# Patient Record
Sex: Male | Born: 1990 | Race: White | Hispanic: No | Marital: Married | Smoking: Never smoker
Health system: Southern US, Community
[De-identification: ages and names within clinical notes are randomized; demographics above are authoritative.]

---

## 2014-07-20 ENCOUNTER — Other Ambulatory Visit (INDEPENDENT_AMBULATORY_CARE_PROVIDER_SITE_OTHER): Payer: BLUE CROSS/BLUE SHIELD

## 2014-07-20 ENCOUNTER — Ambulatory Visit (INDEPENDENT_AMBULATORY_CARE_PROVIDER_SITE_OTHER): Payer: BLUE CROSS/BLUE SHIELD | Admitting: Family Medicine

## 2014-07-20 ENCOUNTER — Other Ambulatory Visit: Payer: Self-pay | Admitting: Family Medicine

## 2014-07-20 ENCOUNTER — Ambulatory Visit (INDEPENDENT_AMBULATORY_CARE_PROVIDER_SITE_OTHER)
Admission: RE | Admit: 2014-07-20 | Discharge: 2014-07-20 | Disposition: A | Discharge status: Home / Self Care | Payer: BLUE CROSS/BLUE SHIELD | Source: Ambulatory Visit | Attending: Family Medicine | Admitting: Family Medicine

## 2014-07-20 ENCOUNTER — Encounter: Payer: Self-pay | Admitting: Family Medicine

## 2014-07-20 VITALS — BP 104/78 | Ht 74.0 in | Wt 196.0 lb

## 2014-07-20 DIAGNOSIS — M5412 Radiculopathy, cervical region: Secondary | ICD-10-CM | POA: Insufficient documentation

## 2014-07-20 DIAGNOSIS — M79602 Pain in left arm: Secondary | ICD-10-CM

## 2014-07-20 DIAGNOSIS — M549 Dorsalgia, unspecified: Secondary | ICD-10-CM

## 2014-07-20 DIAGNOSIS — M6281 Muscle weakness (generalized): Secondary | ICD-10-CM

## 2014-07-20 MED ORDER — PREDNISONE 50 MG PO TABS: ORAL_TABLET | ORAL | Status: DC

## 2014-07-20 NOTE — Assessment & Plan Note (Addendum)
Patient's muscle weakness I think is secondary to a nerve. I do believe that this mostly a C5 nerve root impingement of some sort. With C5 and C6 weakness that upper cord or lateral column could be causing the discomfort. 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 Differential includes cervical radiculopathy, thoracic outlet syndrome and brachial plexus, with it being asymmetric spinal cord disease as well as spinal stenosis has to be considered. Myasthenia gravis as well as demyelinating syndrome such as multiple sclerosis is also within the differential still at this time. I am hoping patient is having more of a mononeuropathy. Patient will be treated with prednisone at this time.  Patient does have more of a peripheral nerve with patient having a decrease deep tendon reflexes. Do not think that this is likely more of an upper nerve and more peripheral in nature. Patient responds to the prednisone but starts having worsening symptoms again further workup including lab workup for hypothyroidism, hypoglycemia, lead toxicity, B12 deficiency could be contribute in. We will discuss this at follow-up.  Patient also be referred to neurology for further evaluation. X-rays of the cervical neck were also ordered today to see if we can evaluate for spinal stenosis. Advance imaging may be necessary in the long run.

## 2014-07-20 NOTE — Patient Instructions (Addendum)
Good to meet you Harold Rice is your friend.  Prednisone 50mg  daily for 5 days.  Continue to monitor symptoms Lets get xray downstairs and will get Neuro involved.  Vitamin D 2000 IU daily  See me again  1 week.

## 2014-07-20 NOTE — Progress Notes (Signed)
[facility] [address] [place], [place] [address] Phone: [phone] Subjective:     CC: Left arm weakness  SLP:[id number] Harold Rice is a 24 y.o. male coming in with complaint of left arm weakness. Patient states he has been seen a physical therapist for another problem and was doing some self manual technique at home. Patient was laying on top of a ball and when he went up he started having of weird sensation going down his left arm. Patient states that since then he has noticed significant weakness in the left arm. Patient was seen by the physical therapist was told to come here for further evaluation. Patient states that he is having numbness that is associated with this mostly in the thumb index and middle finger of the left hand. States that he has difficulty holding anything in that hand that is of any weight. Patient states he can bring the shoulder up once he gets above 90 the shoulder has weakness and numbness difficult to hold in a specific area. Patient has never had any history of this previously. Patient denies any recent illnesses. Patient states that there has been some mild neck discomfort. Patient states certain activities he can still do. Patient has only noticed some very minimal improvement since the initial incident.       Past medical history, social, surgical and family history all reviewed in electronic medical record.   Review of Systems: No headache, visual changes, nausea, vomiting, diarrhea, constipation, dizziness, abdominal pain, skin rash, fevers, chills, night sweats, weight loss, swollen lymph nodes, body aches, joint swelling, muscle aches, chest pain, shortness of breath, mood changes.   Objective Blood pressure 104/78, height 6\' 2"  (1.88 m), weight 196 lb (88.905 kg).  General: No apparent distress alert and oriented x3 mood and affect normal, dressed appropriately. Minorly pale HEENT: Pupils equal, extraocular movements  intact  Respiratory: Patient's speak in full sentences and does not appear short of breath  Cardiovascular: No lower extremity edema, non tender, no erythema  Skin: Warm dry intact with no signs of infection or rash on extremities or on axial skeleton.  Abdomen: Soft nontender  Neuro: Cranial nerves II through XII are intact, neurovascularly intact in all extremities   Lymph: No lymphadenopathy of posterior or anterior cervical chain or axillae bilaterally.  Gait normal with good balance and coordination.  MSK:  Non tender with full range of motion and good stability and symmetric strength and tone of  elbows, wrist, hip, knee and ankles bilaterally.  Shoulder: Left Inspection reveals no abnormalities, atrophy or asymmetry. Palpation is normal with no tenderness over AC joint or bicipital groove. ROM is full in all planes passively. Actively patient can get the full range of motion but: Greater than 95 and forward flexion does cause patient have some instability. Rotator cuff strength normal throughout. No signs of impingement with negative Neer and Hawkin's tests, empty can sign. Speeds and Yergason's tests normal. No labral pathology noted with negative Obrien's, negative clunk and good stability. Normal scapular function observed. No painful arc and no drop arm sign. No apprehension sign Patient is found to have weakness though of the bicep. Patient is unable to have any strength. Patient is 2 out of 5 compared to 5 out of 5 on the contralateral side. Patient does have some mild weakness of the tricep tendon. Patient see tendon reflexes are decreased at 0-1+ on the left upper extremity of the bicep as well as the brachial radialis  with 2+ to the tricep. Contralateral side is 2+. Patient does have weakness of pincer grasp. Mild hypotonia of the bicep compared to the other side already.  Neck: Inspection unremarkable. No palpable stepoffs. Negative Spurling's maneuver. Full neck range  of motion  MSK US performed of: Left This study was ordered, performed, and interpreted by Terrilee Files D.O.  Wrist: All extensor compartments visualized and tendons all normal in appearance without fraying, tears, or sheath effusions. No effusion seen. TFCC intact. Scapholunate ligament intact. Carpal tunnel visualized and median nerve area normal, flexor tendons all normal in appearance without fraying, tears, or sheath effusions. Power doppler signal normal.  IMPRESSION:  NORMAL ULTRASONOGRAPHIC EXAMINATION OF THE WRIST.    Impression and Recommendations:     This case required medical decision making of moderate complexity.

## 2014-07-22 ENCOUNTER — Ambulatory Visit (INDEPENDENT_AMBULATORY_CARE_PROVIDER_SITE_OTHER): Payer: BLUE CROSS/BLUE SHIELD | Admitting: Neurology

## 2014-07-22 ENCOUNTER — Other Ambulatory Visit: Payer: Self-pay | Admitting: *Deleted

## 2014-07-22 ENCOUNTER — Other Ambulatory Visit: Payer: Self-pay | Admitting: Family Medicine

## 2014-07-22 ENCOUNTER — Telehealth: Payer: Self-pay | Admitting: Family Medicine

## 2014-07-22 ENCOUNTER — Encounter: Payer: Self-pay | Admitting: Neurology

## 2014-07-22 VITALS — BP 104/62 | HR 53 | Resp 16 | Ht 75.0 in | Wt 196.0 lb

## 2014-07-22 DIAGNOSIS — M5412 Radiculopathy, cervical region: Secondary | ICD-10-CM

## 2014-07-22 DIAGNOSIS — R29898 Other symptoms and signs involving the musculoskeletal system: Secondary | ICD-10-CM

## 2014-07-22 DIAGNOSIS — M6281 Muscle weakness (generalized): Secondary | ICD-10-CM

## 2014-07-22 NOTE — Telephone Encounter (Signed)
Left msg on voicemail to call me back need to discuss MRI.

## 2014-07-22 NOTE — Progress Notes (Signed)
NEUROLOGY CONSULTATION NOTE  Martinique Houde MRN: [id number] DOB: [birth year]  Referring provider: Dr. Hulan Saas Primary care provider: none  Reason for consult:  Left arm weakness and numbness  Dear Dr Tamala Julian:  Thank you for your kind referral of Martinique Magan for consultation of the above symptoms. Although his history is well known to you, please allow me to reiterate it for the purpose of our medical record. Records and images were personally reviewed where available.  HISTORY OF PRESENT ILLNESS: This is a 24 year old right-handed man with a history of left hip pain since July 2011, who was doing PT exercises 2 weeks ago while laying on top of a ball with both arms flexed behind his head when he started having a sensation of ice water going down his left arm after he finished the exercise. This was followed by numbness and tingling in the left thumb, index, and middle finger. He noticed difficulty holding things in his left hand. He did not report any significant pain at that time but has some neck tightness and soreness. He was evaluated 2 days ago, with note of 2/5 strength of the left bicep, mild weakness at tricep, weakness of pincer grasp, asymmetric reduced reflexes on the left UE. Xray of the cervical spine did not show any acute change, there was mild neural foraminal encroachment on the right at C3. Ultrasound of the wrist normal. He was started on Prednisone, currently on day 2 of steroids.   He denies any right-sided or leg symptoms. He has been seeing a Restaurant manager, fast food, acupuncturist, and PT for left hip pain since July 2011. He reports that "nothing helps." He initially had pain in his left lower back that moved to his left hip, into the psoas/groin area. Pain is worse getting out of bed. He reports a constant low-grade dull pain and soreness in the region. No bowel/bladder dysfunction or leg weakness. He denies any headaches, dizziness, diplopia, dysarthria, dysphagia. No falls.  No family history of similar symptoms.   PAST MEDICAL HISTORY: No past medical history on file.  PAST SURGICAL HISTORY: No past surgical history on file.  MEDICATIONS: Current Outpatient Prescriptions on File Prior to Visit  Medication Sig Dispense Refill  . predniSONE (DELTASONE) 50 MG tablet 1 tab daily for 5 days. 5 tablet 0   No current facility-administered medications on file prior to visit.    ALLERGIES: Allergies  Allergen Reactions  . Amoxicillin Rash    FAMILY HISTORY: No family history on file.  SOCIAL HISTORY: History   Social History  . Marital Status: Single    Spouse Name: N/A    Number of Children: N/A  . Years of Education: N/A   Occupational History  . Not on file.   Social History Main Topics  . Smoking status: Never Smoker   . Smokeless tobacco: Not on file  . Alcohol Use: No     Comment: Rarely  . Drug Use: Not on file  . Sexual Activity: Not on file   Other Topics Concern  . Not on file   Social History Narrative    REVIEW OF SYSTEMS: Constitutional: No fevers, chills, or sweats, no generalized fatigue, change in appetite Eyes: No visual changes, double vision, eye pain Ear, nose and throat: No hearing loss, ear pain, nasal congestion, sore throat Cardiovascular: No chest pain, palpitations Respiratory:  No shortness of breath at rest or with exertion, wheezes GastrointestinaI: No nausea, vomiting, diarrhea, abdominal pain, fecal incontinence Genitourinary:  No dysuria, urinary  retention or frequency Musculoskeletal:  + neck pain, back pain Integumentary: No rash, pruritus, skin lesions Neurological: as above Psychiatric: No depression, insomnia, anxiety Endocrine: No palpitations, fatigue, diaphoresis, mood swings, change in appetite, change in weight, increased thirst Hematologic/Lymphatic:  No anemia, purpura, petechiae. Allergic/Immunologic: no itchy/runny eyes, nasal congestion, recent allergic reactions, rashes  PHYSICAL  EXAM: Filed Vitals:   07/22/14 0940  BP: 104/62  Pulse: 53  Resp: 16   General: No acute distress Head:  Normocephalic/atraumatic Eyes: Fundoscopic exam shows bilateral sharp discs, no vessel changes, exudates, or hemorrhages Neck: supple, no paraspinal tenderness, full range of motion Back: No paraspinal tenderness Heart: regular rate and rhythm Lungs: Clear to auscultation bilaterally. Vascular: No carotid bruits. Skin/Extremities: No rash, no edema. Rash over the forehead and right arm, left leg from ivy plant Neurological Exam: Mental status: alert and oriented to person, place, and time, no dysarthria or aphasia, Fund of knowledge is appropriate.  Recent and remote memory are intact.  Attention and concentration are normal.    Able to name objects and repeat phrases. Cranial nerves: CN I: not tested CN II: pupils equal, round and reactive to light, visual fields intact, fundi unremarkable. CN III, IV, VI:  full range of motion, no nystagmus, no ptosis CN V: facial sensation intact CN VII: upper and lower face symmetric CN VIII: hearing intact to finger rub CN IX, X: gag intact, uvula midline CN XI: sternocleidomastoid and trapezius muscles intact CN XII: tongue midline Bulk & Tone: normal, no fasciculations. Motor: 5/5 on the right UE, both LE. He has 4/5 strength on left shoulder abduction, elbow flexion, 4+/5 shoulder adduction, 5/5 elbow extension, pronation/supination, wrist extension, finger extension, intrinsics. Sensation: reports sensation is equal, however he has spreading paresthesias with pin on the left C6-7 dermatomal distribution (upper arm, down to thumb, index and middle fingers). Intact to light touch, cold, pin, vibration and joint position sense.  No extinction to double simultaneous stimulation.  Romberg test negative Deep Tendon Reflexes: unable to elicit reflexes on the left UE, +2 on right UE and both LE, no ankle clonus Plantar responses: downgoing  bilaterally Cerebellar: no incoordination on finger to nose, heel to shin. No dysdiadochokinesia Gait: narrow-based and steady, able to tandem walk adequately. Tremor: none +Tinel sign on the left wrist  IMPRESSION: This is a 24 year old right-handed man with a history of left hip pain since 2011, presenting with left arm weakness and numbness affecting mostly the C5-7 distribution. Considerations include cervical radiculopathy versus brachial neuritis. He will be scheduled for an EMG/NCV with Dr. Posey Pronto. He will benefit from MRI cervical spine with and without contrast. He tells me he thought this visit was for an MRI and that this has been scheduled for him by Dr. Tamala Julian, he will let us know when test is done. Bloodwork for ESR, CRP, ANA, ENA, TSH, and B12 will be ordered. If tests unrevealing, MRI brachial plexus will be ordered. We discussed treatment with physical therapy and completion of steroid course. He will follow-up after the EMG.   Thank you for allowing me to participate in the care of this patient. Please do not hesitate to call for any questions or concerns.   Ellouise Newer, M.D.  CC: Dr. Tamala Julian

## 2014-07-22 NOTE — Patient Instructions (Signed)
1. Schedule EMG/NCV of the left UE with Dr. Allena Katz 2. Proceed with MRI as discussed with Dr. Katrinka Blazing 3. Continue with Prednisone 4. Proceed with physical therapy for left arm weakness 5. Follow-up after EMG

## 2014-07-22 NOTE — Telephone Encounter (Signed)
-----  Message from Cameron Sprang, MD sent at 07/22/2014 12:53 PM EST ----- Regarding: MRI Patient thought he was here for an MRI today. Can you pls ask him to let us know when/if he is scheduled for MRI. If none, would do MRI cervical spine with and without contrast. Also, let him know that if no bloodwork done with Dr. Tamala Julian, let's do ESR, CRP, ANA, ENA, TSH, and B12. Thanks!

## 2014-07-23 NOTE — Telephone Encounter (Signed)
Patient returned my call. He is already scheduled for a MRI C-spine for next week. He didn't have any labs done with Dr. Katrinka Blazing. Lab orders placed in Epic he will come pick up order at front desk & take to [facility] for blood draw.

## 2014-07-23 NOTE — Addendum Note (Signed)
Addended by: Franciso Bend on: 07/23/2014 03:09 PM   Modules accepted: Orders

## 2014-07-27 ENCOUNTER — Ambulatory Visit: Payer: BLUE CROSS/BLUE SHIELD | Admitting: Family Medicine

## 2014-07-29 ENCOUNTER — Ambulatory Visit
Admission: RE | Admit: 2014-07-29 | Discharge: 2014-07-29 | Disposition: A | Discharge status: Home / Self Care | Payer: BLUE CROSS/BLUE SHIELD | Source: Ambulatory Visit | Attending: Family Medicine | Admitting: Family Medicine

## 2014-07-29 DIAGNOSIS — M6281 Muscle weakness (generalized): Secondary | ICD-10-CM

## 2014-07-29 LAB — C-REACTIVE PROTEIN

## 2014-07-29 LAB — TSH: TSH: 1.925 u[IU]/mL (ref 0.350–4.500)

## 2014-07-29 LAB — SEDIMENTATION RATE: SED RATE: 1 mm/h (ref 0–16)

## 2014-07-29 LAB — VITAMIN B12: Vitamin B-12: 646 pg/mL (ref 211–911)

## 2014-07-30 ENCOUNTER — Ambulatory Visit
Admission: RE | Admit: 2014-07-30 | Discharge: 2014-07-30 | Disposition: A | Discharge status: Home / Self Care | Payer: BLUE CROSS/BLUE SHIELD | Source: Ambulatory Visit | Attending: Family Medicine | Admitting: Family Medicine

## 2014-07-30 DIAGNOSIS — M6281 Muscle weakness (generalized): Secondary | ICD-10-CM

## 2014-07-30 LAB — ENA 9 PANEL
Centromere Ab Screen: <1
DS DNA AB: 1 [IU]/mL
ENA SM Ab Ser-aCnc: <1
Jo-1 Antibody, IgG: <1
Ribosomal P Protein Ab: <1
SM/RNP: NEGATIVE
SSA (Ro) (ENA) Antibody, IgG: <1
SSB (LA) (ENA) ANTIBODY, IGG: NEGATIVE
Scleroderma (Scl-70) (ENA) Antibody, IgG: <1

## 2014-07-30 LAB — ANA: ANA: NEGATIVE

## 2014-07-30 NOTE — Telephone Encounter (Signed)
Pls let him know bloodwork that has come back are normal, including thyroid, B12, and inflammatory markers. Awaiting results of 2 more tests, will update him as they come. Thanks      The other bloodwork have come back, pls let him know these are normal, no evidence of autoimmune disorder. Thanks   Patient was notified of results.

## 2014-08-04 ENCOUNTER — Ambulatory Visit (INDEPENDENT_AMBULATORY_CARE_PROVIDER_SITE_OTHER): Payer: BLUE CROSS/BLUE SHIELD | Admitting: Family Medicine

## 2014-08-04 ENCOUNTER — Encounter: Payer: Self-pay | Admitting: Family Medicine

## 2014-08-04 VITALS — BP 96/62 | HR 49 | Ht 75.0 in | Wt 192.0 lb

## 2014-08-04 DIAGNOSIS — M5412 Radiculopathy, cervical region: Secondary | ICD-10-CM

## 2014-08-04 NOTE — Progress Notes (Signed)
Harold Rice [address] [place], [place] [address] Phone: [phone] Subjective:     CC: Left arm weakness follow-up  DSK:[id number] Harold Rice is a 24 y.o. male coming in with complaint of left arm weakness. Patient was seen previously and was having an acute muscle weakness. There is a questionable of a brachial neuritis. Patient was sent to neurology and has-been worked up by them as well. Patient is here to go over patient's MRI results. Patient did have an MRI of the cervical spine that did not show any significant nerve root impingement. Patient did have mild bulging of herniated discs from seat 3 through C5 but no true nerve root impingement. Patient states that actually he is starting to have increasing sensation as well as increasing strength. Patient is feeling that he can do more and more. Still has some mild difficulty when lifting his shoulder above his head. Patient states though that holding things in his hand is much more tolerable. Patient denies any significant pain associated with this at this time. Patient is scheduled next week to have an EMG and then will be following up with neurology. Patient denies any new symptoms.       Past medical history, social, surgical and family history all reviewed in electronic medical record.   Review of Systems: No headache, visual changes, nausea, vomiting, diarrhea, constipation, dizziness, abdominal pain, skin rash, fevers, chills, night sweats, weight loss, swollen lymph nodes, body aches, joint swelling, muscle aches, chest pain, shortness of breath, mood changes.   Objective Blood pressure 96/62, pulse 49, height 6\' 3"  (1.905 m), weight 192 lb (87.091 kg), SpO2 97 %.  General: No apparent distress alert and oriented x3 mood and affect normal, dressed appropriately. Minorly pale HEENT: Pupils equal, extraocular movements intact  Respiratory: Patient's speak in full sentences and does not appear  short of breath  Cardiovascular: No lower extremity edema, non tender, no erythema  Skin: Warm dry intact with no signs of infection or rash on extremities or on axial skeleton.  Abdomen: Soft nontender  Neuro: Cranial nerves II through XII are intact, neurovascularly intact in all extremities   Lymph: No lymphadenopathy of posterior or anterior cervical chain or axillae bilaterally.  Gait normal with good balance and coordination.  MSK:  Non tender with full range of motion and good stability and symmetric strength and tone of  elbows, wrist, hip, knee and ankles bilaterally.  Shoulder: Left Inspection reveals no abnormalities, atrophy or asymmetry. Palpation is normal with no tenderness over AC joint or bicipital groove. ROM is full in all planes passively. Actively patient can get the full range of motion but: Patient still has some weakness of the shoulder girdle when going above 90 but improved.. Rotator cuff strength normal throughout. No signs of impingement with negative Neer and Hawkin's tests, empty can sign. Speeds and Yergason's tests normal. No labral pathology noted with negative Obrien's, negative clunk and good stability. Normal scapular function observed. No painful arc and no drop arm sign. No apprehension sign Patient is found to have weakness though of the bicep. Patient though has showed improvement h. Patient is 4 out of 5 compared to 5 out of 5 on the contralateral side. Strength of the tricep Patient see tendon reflexes are improved as well to 1+ on the left upper extremity of the bicep as well as the brachial radialis with 2+ to the tricep. Contralateral side is 2+. Patient does have weakness of pincer grasp. Improved  tone of the bicep..  Neck: Inspection unremarkable. No palpable stepoffs. Negative Spurling's maneuver. Full neck range of motion     Impression and Recommendations:     This case required medical decision making of moderate  complexity.

## 2014-08-04 NOTE — Assessment & Plan Note (Signed)
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 Patient is making some improvement. I discussed with patient to continue to try to stay active and to monitor his symptoms. Patient will be following up with neurology after his EMG. I think that this will be very beneficial. I'm hoping patient continues to improve. Patient does not though I do think that further imaging will be necessary such as an MRI of the brachial plexus to help evaluate further. This does not seem to be centrally with the normal cervical MRI. There is no signs of other demyelinating diseases which we can pretty much rule out at this time. Patient's lab work is also been unremarkable. After patient has EMG as well as neurology consult he can follow-up with me with any questions or concerns. Patient will likely follow up with me for other problems such as the hip pain he mentioned when leaving my office.

## 2014-08-04 NOTE — Patient Instructions (Signed)
Good to see you You have improved a lot! Get the EMG and Dr. Karel Jarvis will touch base with you as well.  I am here for any questions.  Once we figure this out then we can consider looking at your hip.

## 2014-08-04 NOTE — Progress Notes (Signed)
Pre visit review using our clinic review tool, if applicable. No additional management support is needed unless otherwise documented below in the visit note. 

## 2014-08-19 ENCOUNTER — Ambulatory Visit (INDEPENDENT_AMBULATORY_CARE_PROVIDER_SITE_OTHER): Payer: BLUE CROSS/BLUE SHIELD | Admitting: Neurology

## 2014-08-19 DIAGNOSIS — R29898 Other symptoms and signs involving the musculoskeletal system: Secondary | ICD-10-CM

## 2014-08-19 DIAGNOSIS — M5412 Radiculopathy, cervical region: Secondary | ICD-10-CM

## 2014-08-19 DIAGNOSIS — R202 Paresthesia of skin: Secondary | ICD-10-CM

## 2014-08-19 NOTE — Procedures (Signed)
[facility]  [address] [place], [address]  [place], [place] [address] Tel: [phone] Fax:  [phone] Test Date:  08/19/2014  Patient: Harold Rice DOB: Nov 27, 1990 Physician: Nita Sickle  Sex: Male Height: 6\' 3"  Ref Phys: Harold Rice  ID#: [id number] Temp: 35.0C Technician: Ala Bent R. NCS T.   Patient Complaints: Patient is a 51 yer old male here for evaluation of his left arm weakness and numbness.  NCV & EMG Findings: Extensive electrodiagnostic testing of the left upper extremity and additional studies of the right shows: 1. Left median, ulnar, and radial sensory responses are within normal limits. Bilateral lateral and median antebrachial cutaneous sensory responses are symmetrically reduced, which is most likely technical in nature. 2. Bilateral ulnar motor responses show slowed conduction velocity across the elbow, with preserved amplitude and latency. The left median motor responses within normal limits.  3. Sparse chronic motor axon loss changes are seen affecting the left deltoid and infraspinatus muscles, without accompanied active denervation.  Impression: 1. 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  2. 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 3. There is no evidence of a brachial plexopathy affecting the left upper extremity.   ___________________________ Nita Sickle    Nerve Conduction Studies Anti Sensory Summary Table   Stim Site NR Peak (ms) Norm Peak (ms) P-T Amp (V) Norm P-T Amp  Left Lat Ante Brach Cutan Anti Sensory (Lat Forearm)  35C  Lat Biceps    2.3 <2.9 15.0 >16  Right Lat Ante Brach Cutan Anti Sensory (Lat Forearm)  35C  Lat Biceps    2.3 <2.9 14.7 >16  Left Med Ante Brach Cutan Anti Sensory (Med Forearm)  35C  Elbow    2.1  16.2   Right Med Ante Brach Cutan Anti Sensory (Med Forearm)  35C  Elbow    2.5  13.2   Left Median  Anti Sensory (2nd Digit)  35C    distance 14cm due to large hands  Wrist    3.1 <3.3 62.5 >20  Left Radial Anti Sensory (Base 1st Digit)  35C  Wrist    2.1 <2.7 41.9 >18  Left Ulnar Anti Sensory (5th Digit)  35C  Wrist    2.9 <3.0 25.3 >18   Motor Summary Table   Stim Site NR Onset (ms) Norm Onset (ms) O-P Amp (mV) Norm O-P Amp Site1 Site2 Delta-0 (ms) Dist (cm) Vel (m/s) Norm Vel (m/s)  Left Median Motor (Abd Poll Brev)  35C  Wrist    3.0 <3.9 13.4 >6 Elbow Wrist 5.0 31.0 62 >51  Elbow    8.0  13.1         Left Ulnar Motor (Abd Dig Minimi)  35C  Wrist    2.3 <3.0 13.4 >8 B Elbow Wrist 4.3 27.0 63 >51  B Elbow    6.6  12.1  A Elbow B Elbow 2.6 10.0 38 >51  A Elbow    9.2  11.1         Right Ulnar Motor (Abd Dig Minimi)  35C  Wrist    2.5 <3.0 13.5 >8 B Elbow Wrist 4.3 26.5 62 >51  B Elbow    6.8  13.2  A Elbow B Elbow 2.3 10.0 43 >51  A Elbow    9.1  12.2          F Wave Studies   NR F-Lat (ms) Lat Norm (ms) L-R  F-Lat (ms)  Left Ulnar (Mrkrs) (Abd Dig Min)  35C     30.80 <33    EMG   Side Muscle Ins Act Fibs Psw Fasc Number Recrt Dur Dur. Amp Amp. Poly Poly. Comment  Left 1stDorInt Nml Nml Nml Nml Nml Nml Nml Nml Nml Nml Nml Nml N/A  Left Abd Poll Brev Nml Nml Nml Nml Nml Nml Nml Nml Nml Nml Nml Nml N/A  Left FlexPolLong Nml Nml Nml Nml Nml Nml Nml Nml Nml Nml Nml Nml N/A  Left Biceps Nml Nml Nml Nml Nml Nml Nml Nml Nml Nml Nml Nml N/A  Left PronatorTeres Nml Nml Nml Nml Nml Nml Nml Nml Nml Nml Nml Nml N/A  Left Ext Indicis Nml Nml Nml Nml Nml Nml Nml Nml Nml Nml Nml Nml N/A  Left Triceps Nml Nml Nml Nml Nml Nml Nml Nml Nml Nml Nml Nml N/A  Left Deltoid Nml Nml Nml Nml 1- Mod Few 1+ Nml Nml Nml Nml N/A  Left Infraspinatus Nml Nml Nml Nml 1- Mod Few 1+ Nml Nml Nml Nml N/A  Left Cervical Parasp Low Nml Nml Nml Nml Nml Nml Nml Nml Nml Nml Nml Nml N/A  Left ABD Dig Min Nml Nml Nml Nml Nml Nml Nml Nml Nml Nml Nml Nml N/A  Right Deltoid Nml Nml Nml Nml Nml Nml Nml Nml Nml  Nml Nml Nml N/A      Waveforms:

## 2014-08-26 ENCOUNTER — Telehealth: Payer: Self-pay | Admitting: Neurology

## 2014-08-26 NOTE — Telephone Encounter (Signed)
Left VM regarding EMG results. Instructed patient to call back.

## 2017-01-29 ENCOUNTER — Ambulatory Visit: Payer: Self-pay | Admitting: Podiatry

## 2017-02-05 ENCOUNTER — Ambulatory Visit (INDEPENDENT_AMBULATORY_CARE_PROVIDER_SITE_OTHER): Payer: 59 | Admitting: Podiatry

## 2017-02-05 ENCOUNTER — Encounter: Payer: Self-pay | Admitting: Podiatry

## 2017-02-05 DIAGNOSIS — M779 Enthesopathy, unspecified: Secondary | ICD-10-CM | POA: Diagnosis not present

## 2017-02-05 DIAGNOSIS — M79671 Pain in right foot: Secondary | ICD-10-CM

## 2017-02-05 DIAGNOSIS — M792 Neuralgia and neuritis, unspecified: Secondary | ICD-10-CM | POA: Diagnosis not present

## 2017-02-05 DIAGNOSIS — G8929 Other chronic pain: Secondary | ICD-10-CM | POA: Diagnosis not present

## 2017-02-05 NOTE — Progress Notes (Signed)
   Subjective:    Patient ID: Harold Rice, male    DOB: 1991/04/07, 26 y.o.   MRN: [id number]  HPI  Harold presents the office today for concerns of right ankle pain which is been ongoing for about 10 years.He is previously seen 2 other podiatrists for this and he had a tight soleus muscle. He was also old that his symptoms were neurological in origin as he was having sharp pains down his leg. He had a nerve conduction t performed which revealed L5-S1 nerve impingement and he was told that he could do steroid injections but he did not want to do this. He was also told by Dr. Elijah Birk that he had an old fracture to his foot and the patient believes that this may be causing some of his symptoms. He also feels that in the front of his ankle he feels like it is "bone on bone". He has seen neurology for this as well. He did try PT but this did not help.    Review of Systems  All other systems reviewed and are negative.      Objective:   Physical Exam General: AAO x3, NAD  Dermatological: Skin is warm, dry and supple bilateral. Nails x 10 are well manicured; remaining integument appears unremarkable at this time. There are no open sores, no preulcerative lesions, no rash or signs of infection present.  Vascular: Dorsalis Pedis artery and Posterior Tibial artery pedal pulses are 2/4 bilateral with immedate capillary fill time.  There is no pain with calf compression, swelling, warmth, erythema.   Neruologic: Grossly intact via light touch bilateral. Vibratory intact via tuning fork bilateral. Protective threshold with Semmes Wienstein monofilament intact to all pedal sites bilateral.   Musculoskeletal: There is no area pinpoint bony tenderness and there is no pain with vibratory sensation identified to bilateral lower extremities. On the right lateral aspect of the foot there is mild tenderness over the sinus tarsi. There is no significant discomfort with ankle or STJ ROM. He does stand up and when he  simulates dorsiflexion of his ankle on the front of his ankle is where he gets pain at times.  Muscular strength 5/5 in all groups tested bilateral.  Gait: Unassisted, Nonantalgic.      Assessment & Plan:  26 year old male with chronic right ankle and foot pain; neuritis -Treatment options discussed including all alternatives, risks, and complications -Etiology of symptoms were discussed -I recommended new x-rays today but he did not want to get them today. He did call Dr. Tasia Catchings office and he is going to pick them up and bring them to the [place] office for me to look at.  -After I look at the x-rays, likely order an MRI due to the chronic pain to his foot. He has seen several other doctors for this without any resolution.  -Discussed steroid injection into the STJ today but will await the x-rays.  -Also discussed getting a forced dorsiflexion ankle x-ray lateral view to see if there is any osseous entrapment.  -Ultimately, I think that some of his symptoms are neurological in origin and encouraged to also follow -up with neurology/PCP  Ovid Curd, DPM

## 2017-02-12 ENCOUNTER — Telehealth: Payer: Self-pay | Admitting: *Deleted

## 2017-02-12 DIAGNOSIS — M779 Enthesopathy, unspecified: Secondary | ICD-10-CM

## 2017-02-12 DIAGNOSIS — M25521 Pain in right elbow: Secondary | ICD-10-CM

## 2017-02-12 DIAGNOSIS — M792 Neuralgia and neuritis, unspecified: Secondary | ICD-10-CM

## 2017-02-12 DIAGNOSIS — M79671 Pain in right foot: Secondary | ICD-10-CM

## 2017-02-12 DIAGNOSIS — G8929 Other chronic pain: Secondary | ICD-10-CM

## 2017-02-12 NOTE — Telephone Encounter (Addendum)
----- -  Message from Vivi Barrack, DPM sent at 02/11/2017 12:34 PM EDT ----- Can you please let him know that I reviewed the x-rays and I do not appreciate any significant fracture. Given duration of symptoms, please order an MRI of the right foot and ankle to rule out any tendon tear and arthritis. 02/12/2017-Left Message for pt to call for instructions. 02/13/2017-Left message for pt to call for instructions.02/18/2017-Due to the importance of Dr. Gabriel Rung orders I informed pt the orders for the right foot and ankle MRI would be faxed to the [facility] and they would call to schedule an appt, and to call me with any concerns. Pt returned my call states he had returned from vacation. Pt states he had a nerve conduction test and was told the pain in his foot was from the spine and wanted to know if Dr. Ardelle Anton would order a MRI of the spine. I sent message to Dr. Ardelle Anton. Gave orders to [facility] for pre-cert and faxed to [facility].02/21/2017-Left message informing pt of Dr. Gabriel Rung recommendation to have PCP order the back MRI, gave [facility] [phone] to coordinated back and lower extremity MRIs.03/04/2017-Left message informing pt that his insurance was requiring a PEER TO PEER for Ankle MRI and Dr. Ardelle Anton wanted to know if he still wanted the ankle MRI or if he was just getting the back MRI from his PCP.

## 2017-02-13 NOTE — Telephone Encounter (Deleted)
-----   Message from Vivi Barrack, DPM sent at 02/11/2017 12:34 PM EDT ----- Can you please let him know that I reviewed the x-rays and I do not appreciate any significant fracture. Given duration of symptoms, please order an MRI of the right foot and ankle to rule out any tendon tear and arthritis.

## 2017-02-18 NOTE — Telephone Encounter (Signed)
Can you see when he had this NCV done? I don't have the copies. I do think that this is mostly coming from his back. I had discussed this with him at his appointment but he has been told that he had an "old break" on x-ray and he wanted to investigate that further.

## 2017-02-19 NOTE — Telephone Encounter (Signed)
Yes, I saw that and we discussed this. He even told me he didn't think it was coming from his back when I saw him. He needs to see his PCP in order to order an MRI of his lumbar spine.

## 2017-12-26 DIAGNOSIS — M5416 Radiculopathy, lumbar region: Secondary | ICD-10-CM | POA: Insufficient documentation

## 2017-12-26 DIAGNOSIS — M5136 Other intervertebral disc degeneration, lumbar region: Secondary | ICD-10-CM | POA: Insufficient documentation

## 2017-12-26 HISTORY — DX: Other intervertebral disc degeneration, lumbar region: M51.36

## 2018-01-17 DIAGNOSIS — Q7649 Other congenital malformations of spine, not associated with scoliosis: Secondary | ICD-10-CM | POA: Insufficient documentation

## 2018-01-17 HISTORY — DX: Other congenital malformations of spine, not associated with scoliosis: Q76.49

## 2018-03-10 DIAGNOSIS — M25561 Pain in right knee: Secondary | ICD-10-CM | POA: Insufficient documentation

## 2018-05-19 ENCOUNTER — Ambulatory Visit (INDEPENDENT_AMBULATORY_CARE_PROVIDER_SITE_OTHER): Payer: 59 | Admitting: Neurology

## 2018-05-19 ENCOUNTER — Encounter: Payer: Self-pay | Admitting: Neurology

## 2018-05-19 DIAGNOSIS — M79604 Pain in right leg: Secondary | ICD-10-CM

## 2018-05-19 NOTE — Progress Notes (Signed)
Please refer to EMG and nerve conduction procedure note.  

## 2018-05-19 NOTE — Progress Notes (Addendum)
Please refer to EMG nerve conduction study procedure note.   MNC    Nerve / Sites Muscle Latency Ref. Amplitude Ref. Rel Amp Segments Distance Velocity Ref. Area    ms ms mV mV %  cm m/s m/s mVms  R Peroneal - EDB     Ankle EDB 3.5 ?6.5 8.0 ?2.0 100 Ankle - EDB 9   27.1     Fib head EDB 10.1  7.4  93.6 Fib head - Ankle 36 54 ?44 26.0     Pop fossa EDB 12.3  7.2  96.2 Pop fossa - Fib head 10 46 ?44 24.7         Pop fossa - Ankle      R Tibial - AH     Ankle AH 3.7 ?5.8 14.2 ?4.0 100 Ankle - AH 9   31.2     Pop fossa AH 12.3  11.4  80.5 Pop fossa - Ankle 41 48 ?41 30.8         SNC    Nerve / Sites Rec. Site Peak Lat Ref.  Amp Ref. Segments Distance    ms ms V V  cm  R Sural - Ankle (Calf)     Calf Ankle 3.5 ?4.4 14 ?6 Calf - Ankle 14  R Superficial peroneal - Ankle     Lat leg Ankle 3.9 ?4.4 10 ?6 Lat leg - Ankle 14         F  Wave    Nerve F Lat Ref.   ms ms  R Tibial - AH 52.3 ?56.0       H Reflex    Nerve H Lat Lat Hmax   ms ms   Left Right Ref. Left Right Ref.  Tibial - Soleus 32.5 32.8 ?35.0 32.5 33.1 ?35.0

## 2018-05-19 NOTE — Procedures (Signed)
     HISTORY:  Harold Rice is a 27 year old gentleman with a several year history of pain in the foot and ankle and some right hip and groin pain as well, he believes there is weakness of the right leg.  He is being evaluated for these issues.  NERVE CONDUCTION STUDIES:  Nerve conduction studies were performed on right lower extremity. The distal motor latencies and motor amplitudes for the peroneal and posterior tibial nerves were within normal limits. The nerve conduction velocities for these nerves were also normal. The sensory latencies for the peroneal and sural nerves were within normal limits. The F wave latency for the posterior tibial nerve was within normal limits.   EMG STUDIES:  EMG study was performed on the right lower extremity:  The tibialis anterior muscle reveals 2 to 4K motor units with full recruitment. No fibrillations or positive waves were seen. The peroneus tertius muscle reveals 2 to 4K motor units with full recruitment. No fibrillations or positive waves were seen. The medial gastrocnemius muscle reveals 1 to 3K motor units with full recruitment. No fibrillations or positive waves were seen. The vastus lateralis muscle reveals 2 to 4K motor units with full recruitment. No fibrillations or positive waves were seen. The iliopsoas muscle reveals 2 to 4K motor units with full recruitment. No fibrillations or positive waves were seen. The biceps femoris muscle (long head) reveals 2 to 4K motor units with full recruitment. No fibrillations or positive waves were seen. The lumbosacral paraspinal muscles were tested at 3 levels, and revealed no abnormalities of insertional activity at all 3 levels tested. There was good relaxation.   IMPRESSION:  Nerve conduction studies done on the right lower extremity were unremarkable.  No evidence of a neuropathy was seen.  EMG evaluation of the right lower extremity is unremarkable, no evidence of an overlying lumbosacral  radiculopathy or sciatic neuropathy was seen.  [name] MD 05/19/2018 9:01 AM  [facility] [address] [place], [place] [address]  Phone [phone] Fax [phone]

## 2018-07-28 ENCOUNTER — Ambulatory Visit (INDEPENDENT_AMBULATORY_CARE_PROVIDER_SITE_OTHER): Payer: 59

## 2018-07-28 ENCOUNTER — Ambulatory Visit (INDEPENDENT_AMBULATORY_CARE_PROVIDER_SITE_OTHER): Payer: 59 | Admitting: Sports Medicine

## 2018-07-28 ENCOUNTER — Encounter: Payer: Self-pay | Admitting: Sports Medicine

## 2018-07-28 DIAGNOSIS — R202 Paresthesia of skin: Secondary | ICD-10-CM

## 2018-07-28 DIAGNOSIS — G35 Multiple sclerosis: Secondary | ICD-10-CM | POA: Diagnosis not present

## 2018-07-28 DIAGNOSIS — G35D Multiple sclerosis, unspecified: Secondary | ICD-10-CM

## 2018-07-28 DIAGNOSIS — J0100 Acute maxillary sinusitis, unspecified: Secondary | ICD-10-CM | POA: Diagnosis not present

## 2018-07-28 MED ORDER — DULOXETINE HCL 30 MG PO CPEP: 30.0000 mg | ORAL_CAPSULE | Freq: Every day | ORAL | 3 refills | Status: DC

## 2018-07-28 MED ORDER — GADOBENATE DIMEGLUMINE 529 MG/ML IV SOLN
20.0000 mL | Freq: Once | INTRAVENOUS | Status: AC | PRN
  Administered 2018-07-28: 20 mL via INTRAVENOUS

## 2018-07-28 MED ORDER — MELOXICAM 15 MG PO TABS: ORAL_TABLET | ORAL | 3 refills | Status: DC

## 2018-07-28 NOTE — Progress Notes (Signed)
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  This started with left arm numbness and tingling radiating from the neck down to the first and second fingers.  He tells me he had a cervical epidural without any improvement, he had a cervical spine MRI near months ago.  In addition he has had pain in his back with radiation down the right leg.  Moderate, persistent, no bowel or bladder dysfunction, saddle numbness, constitutional symptoms.  He told me he had a lumbar spine MRI that was completely normal.  He was then treated appropriately with SI joint injections that provided good relief of his pain temporarily.  He had a nerve conduction study and EMG that was completely negative.  He has never had a brain MRI and never had lab testing done.  I reviewed the past medical history, family history, social history, surgical history, and allergies today and no changes were needed.  Please see the problem list section below in epic for further details.  Past Medical History: No past medical history on file. Past Surgical History: No past surgical history on file. Social History: Social History   Socioeconomic History  . Marital status: Single    Spouse name: Not on file  . Number of children: Not on file  . Years of education: Not on file  . Highest education level: Not on file  Occupational History  . Not on file  Social Needs  . Financial resource strain: Not on file  . Food insecurity:    Worry: Not on file    Inability: Not on file  . Transportation needs:    Medical: Not on file    Non-medical: Not on file  Tobacco Use  . Smoking status: Never Smoker  . Smokeless tobacco: Never Used  Substance and Sexual Activity  . Alcohol use: No    Alcohol/week: 0.0 standard drinks    Comment: Rarely  . Drug use: Not on file  . Sexual activity: Not on file    Lifestyle  . Physical activity:    Days per week: Not on file    Minutes per session: Not on file  . Stress: Not on file  Relationships  . Social connections:    Talks on phone: Not on file    Gets together: Not on file    Attends religious service: Not on file    Active member of club or organization: Not on file    Attends meetings of clubs or organizations: Not on file    Relationship status: Not on file  Other Topics Concern  . Not on file  Social History Narrative  . Not on file   Family History: No family history on file. Allergies: Allergies  Allergen Reactions  . Amoxicillin Rash   Medications: See med rec.  Review of Systems: No headache, visual changes, nausea, vomiting, diarrhea, constipation, dizziness, abdominal pain, skin rash, fevers, chills, night sweats, swollen lymph nodes, weight loss, chest pain, body aches, joint swelling, muscle aches, shortness of breath, mood changes, visual or auditory hallucinations.  Objective:    General: Well Developed, well nourished, and in no acute distress.  Neuro: Alert and oriented x3, extra-ocular muscles intact, sensation grossly intact.  Cranial nerves II through XII are intact, motor, sensory, coordinative functions are all grossly intact HEENT: Normocephalic, atraumatic, pupils equal round reactive to light, neck supple, no masses, no lymphadenopathy,  thyroid nonpalpable.  Skin: Warm and dry, no rashes noted.  Cardiac: Regular rate and rhythm, no murmurs rubs or gallops.  Respiratory: Clear to auscultation bilaterally. Not using accessory muscles, speaking in full sentences.  Abdominal: Soft, nontender, nondistended, positive bowel sounds, no masses, no organomegaly.  Neck: Negative spurling's Full neck range of motion Grip strength and sensation normal in bilateral hands Strength good C4 to T1 distribution No sensory change to C4 to T1 Reflexes normal Back Exam:  Inspection: Unremarkable  Motion: Flexion 45  deg, Extension 45 deg, Side Bending to 45 deg bilaterally,  Rotation to 45 deg bilaterally  SLR laying: Negative  XSLR laying: Negative  Palpable tenderness: None. FABER: negative. Sensory change: Gross sensation intact to all lumbar and sacral dermatomes.  Reflexes: 2+ at both patellar tendons, 2+ at achilles tendons, Babinski's downgoing.  Strength at foot  Plantar-flexion: 5/5 Dorsi-flexion: 5/5 Eversion: 5/5 Inversion: 5/5  Leg strength  Quad: 5/5 Hamstring: 5/5 Hip flexor: 5/5 Hip abductors: 5/5  Gait unremarkable.  Impression and Recommendations:    The patient was counselled, risk factors were discussed, anticipatory guidance given.  Paresthesia of upper and lower extremity Migratory paresthesias. Unclear etiology. He tells me he had a cervical and lumbar spine MRI, he is going to go ahead and get me the results and a disc with these, he does have left-sided C6 and C7 radicular symptoms and right-sided L4 radicular symptoms. Because of the migratory nature of his symptoms we are going to get a brain MRI with and without contrast. I am going to check some routine labs, CBC, CMP, sed rate, uric acid, rheumatoid and lupus work-up.  Diabetes testing. Heavy metal testing.  It does sound as though he had bilateral SI joint injections that provided temporary relief, if this is the case he would be a candidate for radiofrequency ablation.  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  I would like to see him back in a week or 2 to go over all of his records. ___________________________________________ Ihor Austin. Benjamin Stain, M.D., ABFM., CAQSM. Primary Care and Sports Medicine [facility]  Adjunct Professor of Family Medicine   [employer]

## 2018-07-28 NOTE — Assessment & Plan Note (Signed)
Migratory paresthesias. Unclear etiology. He tells me he had a cervical and lumbar spine MRI, he is going to go ahead and get me the results and a disc with these, he does have left-sided C6 and C7 radicular symptoms and right-sided L4 radicular symptoms. Because of the migratory nature of his symptoms we are going to get a brain MRI with and without contrast. I am going to check some routine labs, CBC, CMP, sed rate, uric acid, rheumatoid and lupus work-up.  Diabetes testing. Heavy metal testing.  It does sound as though he had bilateral SI joint injections that provided temporary relief, if this is the case he would be a candidate for radiofrequency ablation.  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  I would like to see him back in a week or 2 to go over all of his records.

## 2018-08-05 ENCOUNTER — Ambulatory Visit (INDEPENDENT_AMBULATORY_CARE_PROVIDER_SITE_OTHER): Payer: 59 | Admitting: Sports Medicine

## 2018-08-05 ENCOUNTER — Encounter: Payer: Self-pay | Admitting: Sports Medicine

## 2018-08-05 DIAGNOSIS — R202 Paresthesia of skin: Secondary | ICD-10-CM | POA: Diagnosis not present

## 2018-08-05 LAB — RHEUMATOID FACTOR (IGA, IGG, IGM)
Rheumatoid Factor (IgA): <5 U (ref <=6)
Rheumatoid Factor (IgG): 6 U (ref <=6)
Rheumatoid Factor (IgM): <5 U (ref <=6)

## 2018-08-05 LAB — HEMOGLOBIN A1C
Hgb A1c MFr Bld: 5 % of total Hgb (ref <5.7)
Mean Plasma Glucose: 97 (calc)
eAG (mmol/L): 5.4 (calc)

## 2018-08-05 LAB — URIC ACID: Uric Acid, Serum: 7.3 mg/dL (ref 4.0–8.0)

## 2018-08-05 LAB — COMPREHENSIVE METABOLIC PANEL
ALT: 62 U/L — ABNORMAL HIGH (ref 9–46)
Alkaline phosphatase (APISO): 59 U/L (ref 36–130)
BUN: 13 mg/dL (ref 7–25)
CO2: 25 mmol/L (ref 20–32)
Calcium: 9.5 mg/dL (ref 8.6–10.3)
Creat: 0.98 mg/dL (ref 0.60–1.35)
Globulin: 2.5 g/dL (calc) (ref 1.9–3.7)
Glucose, Bld: 81 mg/dL (ref 65–99)
Potassium: 3.9 mmol/L (ref 3.5–5.3)
Total Protein: 7.2 g/dL (ref 6.1–8.1)

## 2018-08-05 LAB — CBC
HCT: 46.2 % (ref 38.5–50.0)
Hemoglobin: 15.7 g/dL (ref 13.2–17.1)
MCH: 30.3 pg (ref 27.0–33.0)
MCHC: 34 g/dL (ref 32.0–36.0)
MCV: 89.2 fL (ref 80.0–100.0)
MPV: 12.2 fL (ref 7.5–12.5)
Platelets: 216 Thousand/uL (ref 140–400)
RBC: 5.18 Million/uL (ref 4.20–5.80)
RDW: 12.3 % (ref 11.0–15.0)
WBC: 5 10*3/uL (ref 3.8–10.8)

## 2018-08-05 LAB — COMPREHENSIVE METABOLIC PANEL WITH GFR
AG Ratio: 1.9 (calc) (ref 1.0–2.5)
AST: 77 U/L — ABNORMAL HIGH (ref 10–40)
Albumin: 4.7 g/dL (ref 3.6–5.1)
Chloride: 99 mmol/L (ref 98–110)
Sodium: 137 mmol/L (ref 135–146)
Total Bilirubin: 1.6 mg/dL — ABNORMAL HIGH (ref 0.2–1.2)

## 2018-08-05 LAB — LUPUS(12) PANEL
Anti Nuclear Antibody(ANA): NEGATIVE
C3 Complement: 110 mg/dL (ref 82–185)
C4 Complement: 35 mg/dL (ref 15–53)
ENA SM Ab Ser-aCnc: <1 AI
Rheumatoid fact SerPl-aCnc: <14 [IU]/mL (ref <14)
Ribosomal P Protein Ab: <1 AI
SM/RNP: <1 AI
SSA (Ro) (ENA) Antibody, IgG: <1 AI
SSB (La) (ENA) Antibody, IgG: <1 AI
Scleroderma (Scl-70) (ENA) Antibody, IgG: <1 AI
Thyroperoxidase Ab SerPl-aCnc: 1 IU/mL (ref <9)
ds DNA Ab: 2 [IU]/mL

## 2018-08-05 LAB — CK: Total CK: 71 U/L (ref 44–196)

## 2018-08-05 LAB — HEAVY METALS PANEL, BLOOD
Arsenic: <10 mcg/L (ref <23)
Lead: <1 ug/dL (ref <5)
Mercury, B: <5 ug/L (ref 0–10)

## 2018-08-05 LAB — SEDIMENTATION RATE: Sed Rate: 6 mm/h (ref 0–15)

## 2018-08-05 LAB — CYCLIC CITRUL PEPTIDE ANTIBODY, IGG: Cyclic Citrullin Peptide Ab: <16 U

## 2018-08-05 NOTE — Assessment & Plan Note (Signed)
Initially had some migratory paresthesias. Cervical and lumbar spine MRI showed some mild DDD with potential sites of neuroforaminal stenosis. A brain MRI that we did at the last visit showed no evidence of a demyelinating process. CBC, CMP, sed rate, uric acid, rheumatoid and lupus work-ups as well as heavy metal testing was normal. He did have a cervical epidural, lumbar epidural as well as bilateral SI joint injections that really did not provide tremendous relief. 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 Overall he has improved considerably with meloxicam and 30 of Cymbalta. His stress is improving and his symptoms are thus improving as well. We set expectations regarding discomfort with his exam and imaging findings, he understands that he will have some discomfort but our goal is to keep him functional, he is happy with how things are going right now so we will not rock the boat today. Return to see me in 4 weeks, we may potentially discuss increasing his Cymbalta but otherwise I think it sounds as though he is doing significantly better, he is functional, he is happy and does not need any further intervention.

## 2018-08-05 NOTE — Progress Notes (Signed)
Subjective:    CC: Follow-up  HPI: [place] returns, he is doing a lot better, he has multiple exquisitely mild orthopedic complaints, mild cervical DDD, lumbar DDD, SI joint dysfunction, as well as hip labral tearing.  At the last visit we put together all of the data from various orthopedists in the area, and ultimately treated him with meloxicam and Cymbalta.  He does note a good improvement already and is happy with the way things are going.  He did bring all of his records for my review.  I reviewed the past medical history, family history, social history, surgical history, and allergies today and no changes were needed.  Please see the problem list section below in epic for further details.  Past Medical History: No past medical history on file. Past Surgical History: No past surgical history on file. Social History: Social History   Socioeconomic History  . Marital status: Single    Spouse name: Not on file  . Number of children: Not on file  . Years of education: Not on file  . Highest education level: Not on file  Occupational History  . Not on file  Social Needs  . Financial resource strain: Not on file  . Food insecurity:    Worry: Not on file    Inability: Not on file  . Transportation needs:    Medical: Not on file    Non-medical: Not on file  Tobacco Use  . Smoking status: Never Smoker  . Smokeless tobacco: Never Used  Substance and Sexual Activity  . Alcohol use: No    Alcohol/week: 0.0 standard drinks    Comment: Rarely  . Drug use: Not on file  . Sexual activity: Not on file  Lifestyle  . Physical activity:    Days per week: Not on file    Minutes per session: Not on file  . Stress: Not on file  Relationships  . Social connections:    Talks on phone: Not on file    Gets together: Not on file    Attends religious service: Not on file    Active member of club or organization: Not on file    Attends meetings of clubs or organizations: Not on file   Relationship status: Not on file  Other Topics Concern  . Not on file  Social History Narrative  . Not on file   Family History: No family history on file. Allergies: Allergies  Allergen Reactions  . Amoxicillin Rash   Medications: See med rec.  Review of Systems: No fevers, chills, night sweats, weight loss, chest pain, or shortness of breath.   Objective:    General: Well Developed, well nourished, and in no acute distress.  Neuro: Alert and oriented x3, extra-ocular muscles intact, sensation grossly intact.  HEENT: Normocephalic, atraumatic, pupils equal round reactive to light, neck supple, no masses, no lymphadenopathy, thyroid nonpalpable.  Skin: Warm and dry, no rashes. Cardiac: Regular rate and rhythm, no murmurs rubs or gallops, no lower extremity edema.  Respiratory: Clear to auscultation bilaterally. Not using accessory muscles, speaking in full sentences.  Impression and Recommendations:    Paresthesia of upper and lower extremity Initially had some migratory paresthesias. Cervical and lumbar spine MRI showed some mild DDD with potential sites of neuroforaminal stenosis. A brain MRI that we did at the last visit showed no evidence of a demyelinating process. CBC, CMP, sed rate, uric acid, rheumatoid and lupus work-ups as well as heavy metal testing was normal. He did have a  cervical epidural, lumbar epidural as well as bilateral SI joint injections that really did not provide tremendous relief. 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 Overall he has improved considerably with meloxicam and 30 of Cymbalta. His stress is improving and his symptoms are thus improving as well. We set expectations regarding discomfort with his exam and imaging findings, he understands that he will have some discomfort but our goal is to keep him functional, he is happy with how things are going right now so we will not  rock the boat today. Return to see me in 4 weeks, we may potentially discuss increasing his Cymbalta but otherwise I think it sounds as though he is doing significantly better, he is functional, he is happy and does not need any further intervention.  I spent 25 minutes with this patient, greater than 50% was face-to-face time counseling regarding the above diagnoses, specifically his multiple mild degenerative processes that do not require any further specific intervention. ___________________________________________ Ihor Austin. Benjamin Stain, M.D., ABFM., CAQSM. Primary Care and Sports Medicine [facility]  Adjunct Professor of Family Medicine  [employer]

## 2018-09-02 ENCOUNTER — Encounter: Payer: Self-pay | Admitting: Sports Medicine

## 2018-09-02 ENCOUNTER — Ambulatory Visit (INDEPENDENT_AMBULATORY_CARE_PROVIDER_SITE_OTHER): Payer: 59 | Admitting: Sports Medicine

## 2018-09-02 DIAGNOSIS — R202 Paresthesia of skin: Secondary | ICD-10-CM

## 2018-09-02 MED ORDER — DULOXETINE HCL 60 MG PO CPEP: 60.0000 mg | ORAL_CAPSULE | Freq: Every day | ORAL | 3 refills | Status: DC

## 2018-09-02 NOTE — Progress Notes (Signed)
Subjective:    CC: Follow-up  HPI: Harold Rice returns, he is pleasant 28 year old male, he has had multiple orthopedic interventions through outside providers.  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  Really did not change the dose over the past month and not surprisingly his symptoms stayed about the same.  He does endorse a seasonal affective component, as the days of become longer his symptoms of pain and paresthesias has improved.  He is agreeable to go up on the dose of the medication.  Symptoms are mild, persistent.  I reviewed the past medical history, family history, social history, surgical history, and allergies today and no changes were needed.  Please see the problem list section below in epic for further details.  Past Medical History: No past medical history on file. Past Surgical History: No past surgical history on file. Social History: Social History   Socioeconomic History  . Marital status: Single    Spouse name: Not on file  . Number of children: Not on file  . Years of education: Not on file  . Highest education level: Not on file  Occupational History  . Not on file  Social Needs  . Financial resource strain: Not on file  . Food insecurity:    Worry: Not on file    Inability: Not on file  . Transportation needs:    Medical: Not on file    Non-medical: Not on file  Tobacco Use  . Smoking status: Never Smoker  . Smokeless tobacco: Never Used  Substance and Sexual Activity  . Alcohol use: No    Alcohol/week: 0.0 standard drinks    Comment: Rarely  . Drug use: Not on file  . Sexual activity: Not on file  Lifestyle  . Physical activity:    Days per week: Not on file    Minutes per session: Not on file  . Stress: Not on file  Relationships  . Social connections:    Talks on phone: Not on file    Gets together: Not on file    Attends religious service: Not on  file    Active member of club or organization: Not on file    Attends meetings of clubs or organizations: Not on file    Relationship status: Not on file  Other Topics Concern  . Not on file  Social History Narrative  . Not on file   Family History: No family history on file. Allergies: Allergies  Allergen Reactions  . Amoxicillin Rash   Medications: See med rec.  Review of Systems: No fevers, chills, night sweats, weight loss, chest pain, or shortness of breath.   Objective:    General: Well Developed, well nourished, and in no acute distress.  Neuro: Alert and oriented x3, extra-ocular muscles intact, sensation grossly intact.  HEENT: Normocephalic, atraumatic, pupils equal round reactive to light, neck supple, no masses, no lymphadenopathy, thyroid nonpalpable.  Skin: Warm and dry, no rashes. Cardiac: Regular rate and rhythm, no murmurs rubs or gallops, no lower extremity edema.  Respiratory: Clear to auscultation bilaterally. Not using accessory muscles, speaking in full sentences.  Impression and Recommendations:    Paresthesia of upper and lower extremity Good improvement with 30 of Cymbalta, we left things alone over the past month and they have remained about the same. Increasing to 60 of Cymbalta. Return in about 6 weeks to reevaluate. He does admit that there is a significant seasonal affective  component to his mood and pain symptoms. Certainly we have gabapentin and Lyrica as possibilities to use as well. We again discussed that the goal is to keep him functional and happy, and not to cure his pain. I really do not think any other interventional procedures would be indicated here.   ___________________________________________ Ihor Austin. Benjamin Stain, M.D., ABFM., CAQSM. Primary Care and Sports Medicine [facility]  Adjunct Professor of Family Medicine  [employer]

## 2018-09-02 NOTE — Assessment & Plan Note (Signed)
Good improvement with 30 of Cymbalta, we left things alone over the past month and they have remained about the same. Increasing to 60 of Cymbalta. Return in about 6 weeks to reevaluate. He does admit that there is a significant seasonal affective component to his mood and pain symptoms. Certainly we have gabapentin and Lyrica as possibilities to use as well. We again discussed that the goal is to keep him functional and happy, and not to cure his pain. I really do not think any other interventional procedures would be indicated here.

## 2018-10-13 ENCOUNTER — Telehealth: Payer: Self-pay | Admitting: Sports Medicine

## 2018-10-13 NOTE — Telephone Encounter (Signed)
Pt called clinic and left VM that he would like to cancel his follow up visit scheduled for tomorrow with Dr T. Pt states he doesn't feel that he his concerns have been properly addressed and no longer wishes to continue care here. Visit for tomorrow cancelled, voicemail route to Research officer, political party for review.

## 2018-10-14 ENCOUNTER — Ambulatory Visit: Payer: 59 | Admitting: Sports Medicine

## 2018-12-25 ENCOUNTER — Other Ambulatory Visit: Payer: Self-pay | Admitting: Podiatry

## 2018-12-25 DIAGNOSIS — M76821 Posterior tibial tendinitis, right leg: Secondary | ICD-10-CM

## 2018-12-29 ENCOUNTER — Other Ambulatory Visit: Payer: Self-pay

## 2018-12-29 ENCOUNTER — Ambulatory Visit (INDEPENDENT_AMBULATORY_CARE_PROVIDER_SITE_OTHER): Payer: 59

## 2018-12-29 DIAGNOSIS — M76821 Posterior tibial tendinitis, right leg: Secondary | ICD-10-CM

## 2018-12-29 MED ORDER — GADOBUTROL 1 MMOL/ML IV SOLN
10.0000 mL | Freq: Once | INTRAVENOUS | Status: AC | PRN
  Administered 2018-12-29: 10 mL via INTRAVENOUS

## 2019-01-21 ENCOUNTER — Emergency Department (INDEPENDENT_AMBULATORY_CARE_PROVIDER_SITE_OTHER)
Admission: EM | Admit: 2019-01-21 | Discharge: 2019-01-21 | Disposition: A | Discharge status: Home / Self Care | Payer: 59 | Source: Home / Self Care | Attending: Family Medicine | Admitting: Family Medicine

## 2019-01-21 ENCOUNTER — Other Ambulatory Visit: Payer: Self-pay

## 2019-01-21 ENCOUNTER — Encounter: Payer: Self-pay | Admitting: Emergency Medicine

## 2019-01-21 DIAGNOSIS — R51 Headache: Secondary | ICD-10-CM | POA: Diagnosis not present

## 2019-01-21 DIAGNOSIS — R519 Headache, unspecified: Secondary | ICD-10-CM

## 2019-01-21 MED ORDER — DICLOFENAC SODIUM 1 % TD GEL: TRANSDERMAL | 0 refills | Status: DC

## 2019-01-21 NOTE — ED Triage Notes (Signed)
RT scalp pain x 1 week

## 2019-01-21 NOTE — ED Provider Notes (Signed)
[name]    CSN: [id number] Arrival date & time: 01/21/19  1252     History   Chief Complaint Chief Complaint  Patient presents with  . Scalp pain    HPI Harold Rice is a 28 y.o. male.   Patient complains of a vague sensation of pain and swelling in his right parietal area for one week without neurologic symptoms.  He recalls no injury and no rash on his scalp.  He feels well otherwise. He had a negative MR of the brain with and w/o contrast in February (although scan did show right maxillary sinusitis).  The history is provided by the patient.    History reviewed. No pertinent past medical history.  Patient Active Problem List   Diagnosis Date Noted  . Paresthesia of upper and lower extremity 07/28/2018  . Muscle weakness of left upper extremity 07/20/2014  . 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 No pertinent surgical history.     Home Medications    Prior to Admission medications   Medication Sig Start Date End Date Taking? Authorizing Provider  diclofenac sodium (VOLTAREN) 1 % GEL Apply BID.  Use 1/2 to 1 gm each application 01/21/19   Lattie Haw, MD    Family History Family History  Problem Relation Age of Onset  . Diabetes Father   . Thyroid disease Father     Social History Social History   Tobacco Use  . Smoking status: Never Smoker  . Smokeless tobacco: Never Used  Substance Use Topics  . Alcohol use: No    Alcohol/week: 0.0 standard drinks    Comment: Rarely  . Drug use: Not on file     Allergies   Amoxicillin   Review of Systems Review of Systems  Constitutional: Negative.   HENT: Negative for ear pain, facial swelling and sinus pain.   Eyes: Negative.   Respiratory: Negative.   Cardiovascular: Negative.   Gastrointestinal: Negative.   Genitourinary: Negative.   Musculoskeletal: Negative.   Skin: Negative.   Neurological: Positive for headaches. Negative for dizziness, tremors, seizures,  syncope, facial asymmetry, speech difficulty, weakness, light-headedness and numbness.     Physical Exam Triage Vital Signs ED Triage Vitals  Enc Vitals Group     BP 01/21/19 1315 113/76     Pulse Rate 01/21/19 1315 73     Resp --      Temp 01/21/19 1315 98.6 F (37 C)     Temp Source 01/21/19 1315 Oral     SpO2 01/21/19 1315 97 %     Weight 01/21/19 1316 235 lb (106.6 kg)     Height 01/21/19 1316 6\' 4"  (1.93 m)     Head Circumference --      Peak Flow --      Pain Score 01/21/19 1316 5     Pain Loc --      Pain Edu? --      Excl. in GC? --    No data found.  Updated Vital Signs BP 113/76 (BP Location: Right Arm)   Pulse 73   Temp 98.6 F (37 C) (Oral)   Ht 6\' 4"  (1.93 m)   Wt 106.6 kg   SpO2 97%   BMI 28.61 kg/m   Visual Acuity Right Eye Distance:   Left Eye Distance:   Bilateral Distance:    Right Eye Near:   Left Eye Near:    Bilateral Near:     Physical Exam Vitals signs and nursing note  reviewed.  Constitutional:      General: He is not in acute distress.    Appearance: Normal appearance.  HENT:     Head: Normocephalic.      Comments: Right scalp has vague tenderness to palpation but there is no swelling, erythema, or palpable skull abnormality.  No scalp lesions or rash present.    Right Ear: Tympanic membrane, ear canal and external ear normal.     Left Ear: Tympanic membrane, ear canal and external ear normal.     Nose: Nose normal.     Mouth/Throat:     Pharynx: Oropharynx is clear.  Eyes:     Extraocular Movements: Extraocular movements intact.     Conjunctiva/sclera: Conjunctivae normal.     Pupils: Pupils are equal, round, and reactive to light.  Neck:     Musculoskeletal: Neck supple.  Cardiovascular:     Rate and Rhythm: Normal rate.  Pulmonary:     Effort: Pulmonary effort is normal.  Lymphadenopathy:     Cervical: No cervical adenopathy.  Skin:    General: Skin is warm and dry.  Neurological:     General: No focal deficit  present.     Mental Status: He is alert and oriented to person, place, and time.      UC Treatments / Results  Labs (all labs ordered are listed, but only abnormal results are displayed) Labs Reviewed - No data to display  EKG   Radiology No results found.  Procedures Procedures (including critical care time)  Medications Ordered in UC Medications - No data to display  Initial Impression / Assessment and Plan / UC Course  I have reviewed the triage vital signs and the nursing notes.  Pertinent labs & imaging results that were available during my care of the patient were reviewed by me and considered in my medical decision making (see chart for details).    Note recent negative MR head. Begin trial of Voltaren gel BID. Followup with Family Doctor if not improved in about 2 weeks.  Consider possible early herpes zoster (call if rash develops:  Begin Valtrex)  Final Clinical Impressions(s) / UC Diagnoses   Final diagnoses:  Pain of scalp   Discharge Instructions   None    ED Prescriptions    Medication Sig Dispense Auth. Provider   diclofenac sodium (VOLTAREN) 1 % GEL Apply BID.  Use 1/2 to 1 gm each application 50 g Kandra Nicolas, MD        Kandra Nicolas, MD 01/22/19 [phone]

## 2019-08-17 IMAGING — MR MRI OF THE RIGHT ANKLE WITHOUT AND WITH CONTRAST
9 series · 40 of 40 positions shown · IV contrast (10 mL Gadavist)
Comparison: None.

CLINICAL DATA: Chronic right ankle pain. Right posterior tibial
tendinitis.

EXAM:
MRI OF THE RIGHT ANKLE WITHOUT AND WITH CONTRAST
TECHNIQUE: Multiplanar, multisequence MR imaging of the ankle was performed
before and after the administration of intravenous contrast.
CONTRAST:  9 cc Gadavist

[Series 3: T1 · axial · 3.0mm · 0.62mm/px · z∈[-35,+111]mm · 4 of 45 slices shown (1 of 2)]
[im 1/45]
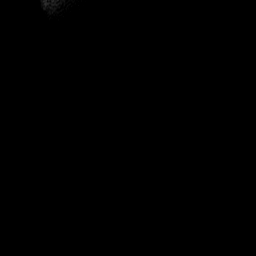
[im 15/45]
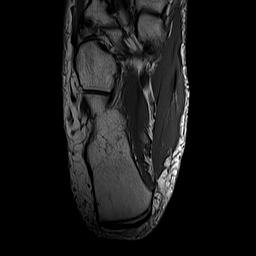
[im 30/45]
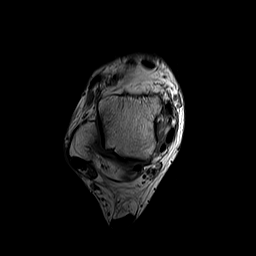
[im 45/45]
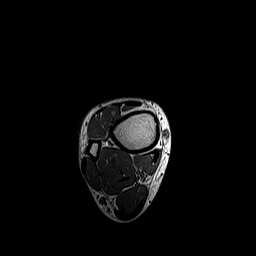

[Series 4: PD fat-sat · axial · 3.0mm · 0.70mm/px · z∈[-32,+113]mm · 5 of 45 slices shown]
[im 1/45]
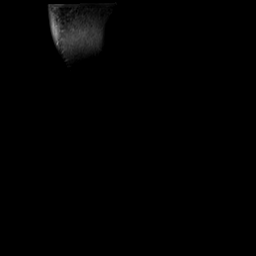
[im 12/45]
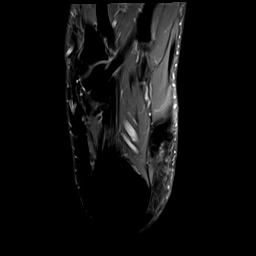
[im 23/45]
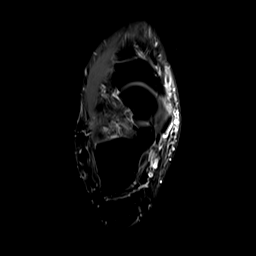
[im 34/45]
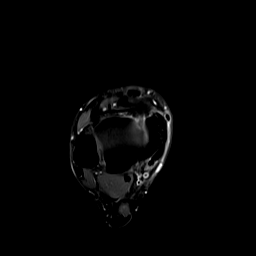
[im 45/45]
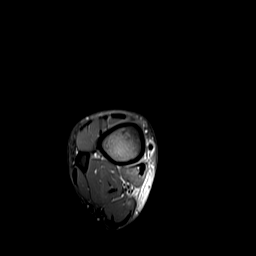

[Series 5: T2 fat-sat · axial · 3.0mm · 0.70mm/px · z∈[-32,+113]mm · 5 of 45 slices shown (1 of 2)]
[im 1/45]
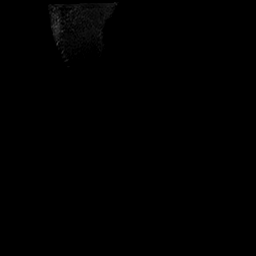
[im 12/45]
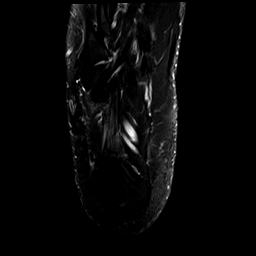
[im 23/45]
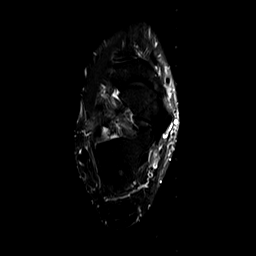
[im 34/45]
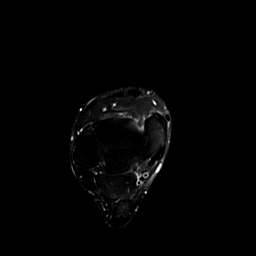
[im 45/45]
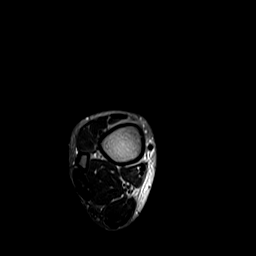

[Series 6: T2 fat-sat · coronal · 3.0mm · 0.70mm/px · 5 of 50 slices shown (2 of 2)]
[im 1/50]
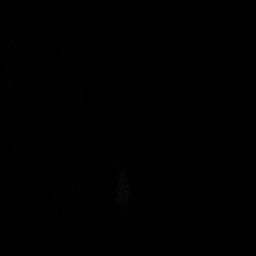
[im 13/50]
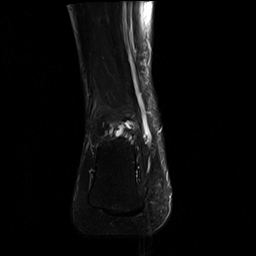
[im 25/50]
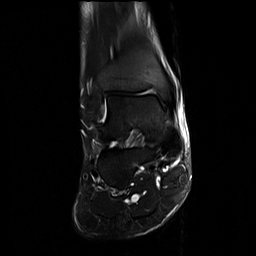
[im 37/50]
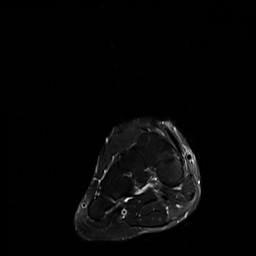
[im 50/50]
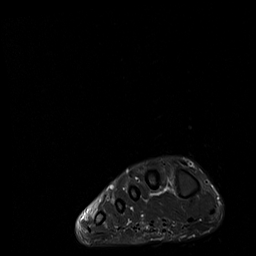

[Series 7: T1 · sagittal · 3.0mm · 0.56mm/px · 3 of 30 slices shown (2 of 2)]
[im 1/30]
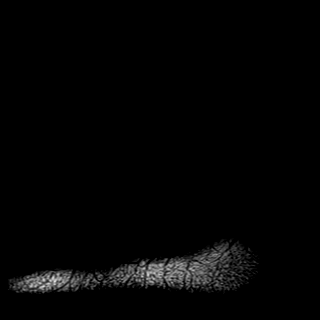
[im 15/30]
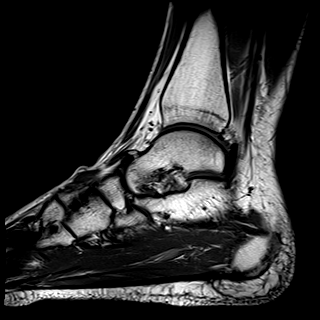
[im 30/30]
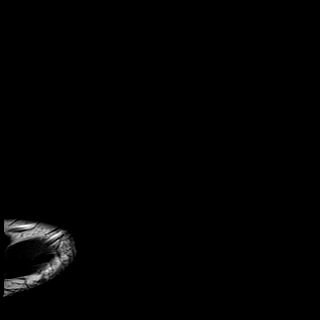

[Series 8: STIR · sagittal · 3.0mm · 0.70mm/px · 3 of 30 slices shown]
[im 1/30]
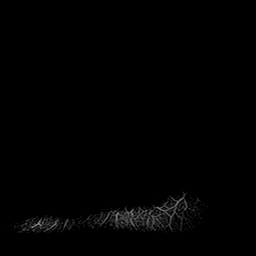
[im 15/30]
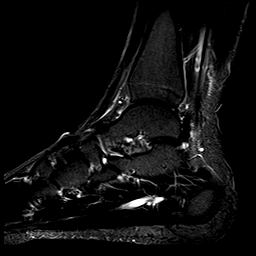
[im 30/30]
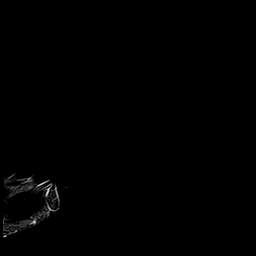

[Series 9: T1 fat-sat · axial · non-contrast · 3.0mm · 0.62mm/px · z∈[-32,+113]mm · 5 of 45 slices shown]
[im 1/45]
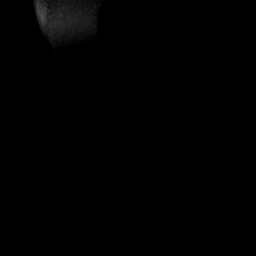
[im 12/45]
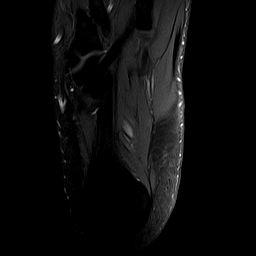
[im 23/45]
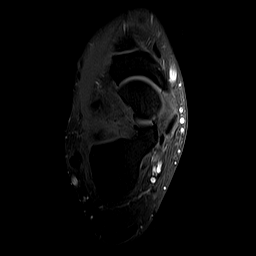
[im 34/45]
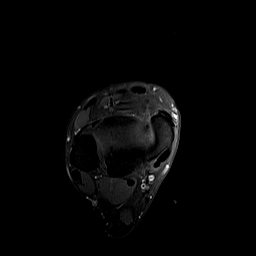
[im 45/45]
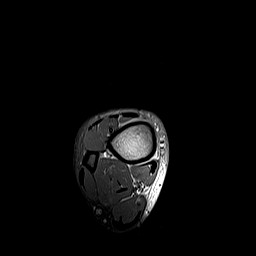

[Series 10: T1 fat-sat post-contrast · axial · 3.0mm · 0.62mm/px · z∈[-32,+113]mm · 5 of 45 slices shown (1 of 2)]
[im 1/45]
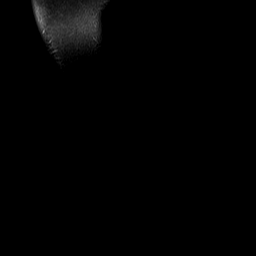
[im 12/45]
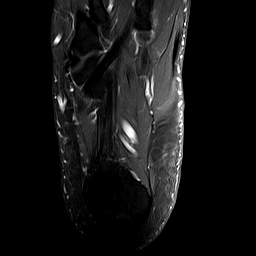
[im 23/45]
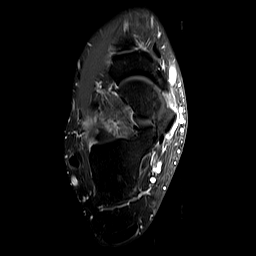
[im 34/45]
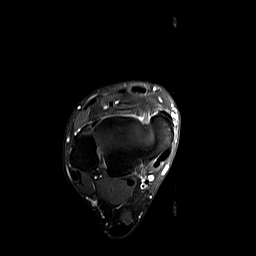
[im 45/45]
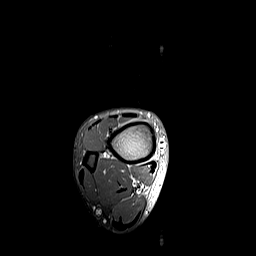

[Series 11: T1 fat-sat post-contrast · coronal · 3.0mm · 0.70mm/px · 5 of 50 slices shown (2 of 2)]
[im 1/50]
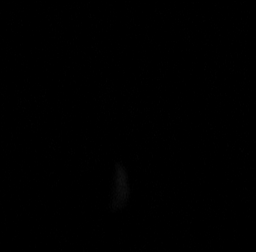
[im 13/50]
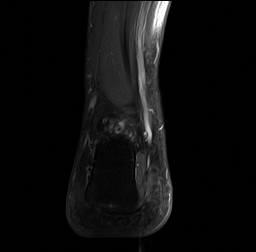
[im 25/50]
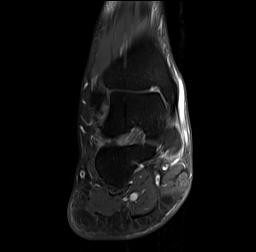
[im 37/50]
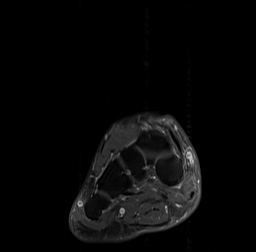
[im 50/50]
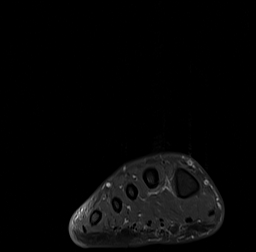

[40 of 40 positions shown; findings below may reference images not displayed]

FINDINGS: TENDONS

Peroneal: There is a tiny amount of fluid in the peroneal tendon
sheath at the level of the lateral malleolus.

Posteromedial: There is a small amount of fluid in the sheath of the
posterior tibialis tendon distal to the tip of the medial malleolus.
After contrast administration there is enhancement of the tendon
sheath and of the distal tendon best seen on series 10. Flexor
hallucis longus and flexor digitorum longus tendons are normal.

Anterior: Normal.

Achilles: Normal.

Plantar Fascia: Normal.

LIGAMENTS

Lateral: Normal.

Medial: Normal.

CARTILAGE

Ankle Joint: Normal.

Subtalar Joints/Sinus Tarsi: Intact cartilage. Small posterior
subtalar joint effusion.

Bones: Minimal dorsal spurring on the distal talus.

Other: None
IMPRESSION: IMPRESSION
1. Tenosynovitis of the posterior tibialis tendon.
2. No other significant abnormality.

## 2019-10-02 DIAGNOSIS — M879 Osteonecrosis, unspecified: Secondary | ICD-10-CM | POA: Insufficient documentation

## 2021-01-30 ENCOUNTER — Ambulatory Visit: Payer: Self-pay

## 2021-08-13 ENCOUNTER — Emergency Department
Admission: RE | Admit: 2021-08-13 | Discharge: 2021-08-13 | Disposition: A | Discharge status: Home / Self Care | Payer: PRIVATE HEALTH INSURANCE | Source: Ambulatory Visit | Attending: Family Medicine | Admitting: Family Medicine

## 2021-08-13 ENCOUNTER — Other Ambulatory Visit: Payer: Self-pay

## 2021-08-13 VITALS — BP 121/82 | HR 78 | Temp 99.7°F | Resp 16 | Ht 75.0 in | Wt 250.0 lb

## 2021-08-13 DIAGNOSIS — J029 Acute pharyngitis, unspecified: Secondary | ICD-10-CM

## 2021-08-13 DIAGNOSIS — R6889 Other general symptoms and signs: Secondary | ICD-10-CM

## 2021-08-13 LAB — POCT INFLUENZA A/B
Influenza A, POC: NEGATIVE
Influenza B, POC: NEGATIVE

## 2021-08-13 LAB — POCT RAPID STREP A (OFFICE): Rapid Strep A Screen: NEGATIVE

## 2021-08-13 MED ORDER — OSELTAMIVIR PHOSPHATE 75 MG PO CAPS
75.0000 mg | ORAL_CAPSULE | Freq: Two times a day (BID) | ORAL | 0 refills | Status: DC
Start: 2021-08-13 — End: 2021-11-15

## 2021-08-13 NOTE — ED Provider Notes (Signed)
[facility]    CSN: [id number] Arrival date & time: 08/13/21  0940      History   Chief Complaint Chief Complaint  Patient presents with   Generalized Body Aches    HPI Harold Rice is a 31 y.o. male.   HPI Flu symptoms getting worse 24 hours Body aches, skin hurts, harsh painful cough, fatigue Minor symptom started 3 d ago Covid tests X 2 negative Works from home  History reviewed. No pertinent past medical history.  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 No pertinent surgical history.     Home Medications    Prior to Admission medications   Medication Sig Start Date End Date Taking? Authorizing Provider  oseltamivir (TAMIFLU) 75 MG capsule Take 1 capsule (75 mg total) by mouth every 12 (twelve) hours. 08/13/21  Yes Eustace Moore, MD  diclofenac sodium (VOLTAREN) 1 % GEL Apply BID.  Use 1/2 to 1 gm each application Patient not taking: Reported on 08/13/2021 01/21/19   Lattie Haw, MD    Family History Family History  Problem Relation Age of Onset   Healthy Mother    Diabetes Father    Thyroid disease Father     Social History Social History   Tobacco Use   Smoking status: Never    Passive exposure: Never   Smokeless tobacco: Never  Vaping Use   Vaping Use: Never used  Substance Use Topics   Alcohol use: No    Alcohol/week: 0.0 standard drinks    Comment: Rarely   Drug use: Never     Allergies   Amoxicillin   Review of Systems Review of Systems  See HPI Physical Exam Triage Vital Signs ED Triage Vitals  Enc Vitals Group     BP 08/13/21 1040 121/82     Pulse Rate 08/13/21 1040 78     Resp 08/13/21 1040 16     Temp 08/13/21 1040 99.7 F (37.6 C)     Temp Source 08/13/21 1040 Oral     SpO2 08/13/21 1040 95 %     Weight 08/13/21 1042 250 lb  (113.4 kg)     Height 08/13/21 1042 6\' 3"  (1.905 m)     Head Circumference --      Peak Flow --      Pain Score 08/13/21 1041 4     Pain Loc --      Pain Edu? --      Excl. in GC? --    No data found.  Updated Vital Signs BP 121/82 (BP Location: Left Arm)    Pulse 78    Temp 99.7 F (37.6 C) (Oral)    Resp 16    Ht 6\' 3"  (1.905 m)    Wt 113.4 kg    SpO2 95%    BMI 31.25 kg/m      Physical Exam Constitutional:      General: He is not in acute distress.    Appearance: He is well-developed. He is ill-appearing.  HENT:     Head: Normocephalic and atraumatic.     Right Ear: Tympanic membrane and ear canal normal.     Left Ear: Tympanic membrane and ear canal normal.  Eyes:     Conjunctiva/sclera: Conjunctivae normal.     Pupils: Pupils are equal, round, and reactive to  light.  Cardiovascular:     Rate and Rhythm: Normal rate and regular rhythm.     Heart sounds: Normal heart sounds.  Pulmonary:     Effort: Pulmonary effort is normal. No respiratory distress.     Breath sounds: Rhonchi present.     Comments: Harsh central rhonchi Abdominal:     General: There is no distension.     Palpations: Abdomen is soft.  Musculoskeletal:        General: Normal range of motion.     Cervical back: Normal range of motion.  Skin:    General: Skin is warm and dry.  Neurological:     Mental Status: He is alert.  Psychiatric:        Mood and Affect: Mood normal.        Behavior: Behavior normal.     UC Treatments / Results  Labs (all labs ordered are listed, but only abnormal results are displayed) Labs Reviewed  CULTURE, GROUP A STREP  POCT INFLUENZA A/B  POCT RAPID STREP A (OFFICE)    EKG   Radiology No results found.  Procedures Procedures (including critical care time)  Medications Ordered in UC Medications - No data to display  Initial Impression / Assessment and Plan / UC Course  I have reviewed the triage vital signs and the nursing notes.  Pertinent labs &  imaging results that were available during my care of the patient were reviewed by me and considered in my medical decision making (see chart for details).     Patient has symptoms consistent with an influenza although his influenza testing is negative.  We will treat with Tamiflu based on his symptom complex. Final Clinical Impressions(s) / UC Diagnoses   Final diagnoses:  Acute pharyngitis, unspecified etiology  Flu-like symptoms     Discharge Instructions      Drink lots of fluids Take over-the-counter cough and cold medicines as needed Take the Tamiflu 2 times a day for 5 days.  Take 2 doses today Call for problems   ED Prescriptions     Medication Sig Dispense Auth. Provider   oseltamivir (TAMIFLU) 75 MG capsule Take 1 capsule (75 mg total) by mouth every 12 (twelve) hours. 10 capsule Eustace Moore, MD      PDMP not reviewed this encounter.   Eustace Moore, MD 08/13/21 1200

## 2021-08-13 NOTE — ED Triage Notes (Addendum)
Fever, body aches & cough x 3 days  Negative Home Covid test negative the last 3 days Tylenol at 0800 Tmax  at 0300 102.3 OTC cough  medicine - min relief

## 2021-08-13 NOTE — Discharge Instructions (Signed)
Drink lots of fluids Take over-the-counter cough and cold medicines as needed Take the Tamiflu 2 times a day for 5 days.  Take 2 doses today Call for problems

## 2021-08-17 LAB — CULTURE, GROUP A STREP: Strep A Culture: NEGATIVE

## 2021-11-15 ENCOUNTER — Ambulatory Visit (INDEPENDENT_AMBULATORY_CARE_PROVIDER_SITE_OTHER): Payer: PRIVATE HEALTH INSURANCE | Admitting: Family Medicine

## 2021-11-15 ENCOUNTER — Encounter: Payer: Self-pay | Admitting: Family Medicine

## 2021-11-15 VITALS — BP 104/66 | HR 55 | Ht 74.0 in | Wt 269.0 lb

## 2021-11-15 DIAGNOSIS — R5383 Other fatigue: Secondary | ICD-10-CM | POA: Diagnosis not present

## 2021-11-15 DIAGNOSIS — M2550 Pain in unspecified joint: Secondary | ICD-10-CM

## 2021-11-15 NOTE — Assessment & Plan Note (Signed)
He is requesting extensive labs today.  Reasonable labs were ordered for work-up of fatigue and joint pain..  In addition to labs he requested I have ordered a B12 and testosterone.  Additionally will check an ACE level as rash may be extrapulmonary manifestation of sarcoid.

## 2021-11-15 NOTE — Progress Notes (Signed)
Harold Rice Headings - 31 y.o. male MRN [id number]  Date of birth: 01-29-1991  Subjective Chief Complaint  Patient presents with   Establish Care    HPI Harold Rice is a 31 year old male here today for initial visit to establish care.  He has complaint of chronic fatigue, paresthesias and joint pain.  He has had several imaging studies which have not revealed anything remarkable.  He has had inflammatory markers checked in the past which have been negative.  He has several additional labs he like to have checked.  He has not had any fevers or chills.  No joint swelling.  He has had hypopigmented rash on his hands bilaterally.  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 No pertinent surgical history.  Social History   Socioeconomic History   Marital status: Married    Spouse name: Not on file   Number of children: Not on file   Years of education: Not on file   Highest education level: Not on file  Occupational History   Not on file  Tobacco Use   Smoking status: Never    Passive exposure: Never   Smokeless tobacco: Never  Vaping Use   Vaping Use: Never used  Substance and Sexual Activity   Alcohol use: No    Comment: Rarely   Drug use: Never   Sexual activity: Yes    Partners: Female    Birth control/protection: Pill  Other Topics Concern   Not on file  Social History Narrative   Not on file   Social Determinants of Health   Financial Resource Strain: Not on file  Food Insecurity: Not on file  Transportation Needs: Not on file  Physical Activity: Not on file  Stress: Not on file  Social Connections: Not on file    Family History  Problem Relation Age of Onset   Healthy Mother    Diabetes Father    Thyroid disease Father     Cancer Father     Health Maintenance  Topic Date Due   HIV Screening  Never done   Hepatitis C Screening  Never done   TETANUS/TDAP  10/23/2016   COVID-19 Vaccine (3 - Moderna risk series) 12/24/2019   INFLUENZA VACCINE  01/23/2022   HPV VACCINES  Aged Out     ----------------------------------------------------------------------------------------------------------------------------------------------------------------------------------------------------------------- Physical Exam BP 104/66 (BP Location: Left Arm, Patient Position: Sitting, Cuff Size: Large)   Pulse (!) 55   Ht 6\' 2"  (1.88 m)   Wt 269 lb (122 kg)   SpO2 99%   BMI 34.54 kg/m   Physical Exam Constitutional:      Appearance: Normal appearance.  Eyes:     General: No scleral icterus. Cardiovascular:     Rate and Rhythm: Normal rate and regular rhythm.  Pulmonary:     Effort: Pulmonary effort is normal.     Breath sounds: Normal breath sounds.  Musculoskeletal:     Cervical back: Neck supple.  Skin:    Comments: Scattered areas of hypopigmentation on backs of hands and wrist bilaterally.  Neurological:     General: No focal deficit present.     Mental Status: He is alert.  Psychiatric:        Mood and Affect: Mood normal.  Behavior: Behavior normal.    ------------------------------------------------------------------------------------------------------------------------------------------------------------------------------------------------------------------- Assessment and Plan  Arthralgia He is requesting extensive labs today.  Reasonable labs were ordered for work-up of fatigue and joint pain..  In addition to labs he requested I have ordered a B12 and testosterone.  Additionally will check an ACE level as rash may be extrapulmonary manifestation of sarcoid.   No orders of the defined types were placed in this encounter.   No follow-ups on file.    This visit occurred during the  SARS-CoV-2 public health emergency.  Safety protocols were in place, including screening questions prior to the visit, additional usage of staff PPE, and extensive cleaning of exam room while observing appropriate contact time as indicated for disinfecting solutions.

## 2021-11-17 LAB — CBC WITH DIFFERENTIAL/PLATELET
Absolute Monocytes: 313 cells/uL (ref 200–950)
Basophils Absolute: 22 cells/uL (ref 0–200)
Basophils Relative: 0.6 %
Eosinophils Absolute: 40 cells/uL (ref 15–500)
Eosinophils Relative: 1.1 %
HCT: 45.7 % (ref 38.5–50.0)
Hemoglobin: 15.1 g/dL (ref 13.2–17.1)
Lymphs Abs: 1494 cells/uL (ref 850–3900)
MCH: 29.2 pg (ref 27.0–33.0)
MCHC: 33 g/dL (ref 32.0–36.0)
MCV: 88.4 fL (ref 80.0–100.0)
MPV: 13.3 fL — ABNORMAL HIGH (ref 7.5–12.5)
Monocytes Relative: 8.7 %
Neutro Abs: 1732 cells/uL (ref 1500–7800)
Neutrophils Relative %: 48.1 %
Platelets: 164 10*3/uL (ref 140–400)
RBC: 5.17 10*6/uL (ref 4.20–5.80)
RDW: 13.4 % (ref 11.0–15.0)
Total Lymphocyte: 41.5 %
WBC: 3.6 10*3/uL — ABNORMAL LOW (ref 3.8–10.8)

## 2021-11-17 LAB — URINALYSIS, ROUTINE W REFLEX MICROSCOPIC
Bilirubin Urine: NEGATIVE
Glucose, UA: NEGATIVE
Hgb urine dipstick: NEGATIVE
Ketones, ur: NEGATIVE
Leukocytes,Ua: NEGATIVE
Nitrite: NEGATIVE
Protein, ur: NEGATIVE
Specific Gravity, Urine: 1.005 (ref 1.001–1.035)
pH: 5.5 (ref 5.0–8.0)

## 2021-11-17 LAB — LIPID PANEL W/REFLEX DIRECT LDL
Cholesterol: 245 mg/dL — ABNORMAL HIGH (ref <200)
HDL: 48 mg/dL (ref >=40)
LDL Cholesterol (Calc): 180 mg/dL (calc) — ABNORMAL HIGH
Non-HDL Cholesterol (Calc): 197 mg/dL (calc) — ABNORMAL HIGH (ref <130)
Total CHOL/HDL Ratio: 5.1 (calc) — ABNORMAL HIGH (ref <5.0)
Triglycerides: 69 mg/dL (ref <150)

## 2021-11-17 LAB — TSH+FREE T4: TSH W/REFLEX TO FT4: 3.58 mIU/L (ref 0.40–4.50)

## 2021-11-17 LAB — HIGH SENSITIVITY CRP: hs-CRP: 1.6 mg/L

## 2021-11-17 LAB — VITAMIN D 25 HYDROXY (VIT D DEFICIENCY, FRACTURES): Vit D, 25-Hydroxy: 45 ng/mL (ref 30–100)

## 2021-11-17 LAB — COMPLETE METABOLIC PANEL WITH GFR
AG Ratio: 1.8 (calc) (ref 1.0–2.5)
ALT: 13 U/L (ref 9–46)
AST: 17 U/L (ref 10–40)
Albumin: 4.6 g/dL (ref 3.6–5.1)
Alkaline phosphatase (APISO): 64 U/L (ref 36–130)
BUN: 14 mg/dL (ref 7–25)
CO2: 23 mmol/L (ref 20–32)
Calcium: 9.3 mg/dL (ref 8.6–10.3)
Chloride: 105 mmol/L (ref 98–110)
Creat: 1.02 mg/dL (ref 0.60–1.26)
Globulin: 2.5 g/dL (calc) (ref 1.9–3.7)
Glucose, Bld: 90 mg/dL (ref 65–99)
Potassium: 4.4 mmol/L (ref 3.5–5.3)
Sodium: 138 mmol/L (ref 135–146)
Total Bilirubin: 1.1 mg/dL (ref 0.2–1.2)
Total Protein: 7.1 g/dL (ref 6.1–8.1)
eGFR: 100 mL/min/{1.73_m2} (ref >=60)

## 2021-11-17 LAB — IRON,TIBC AND FERRITIN PANEL
%SAT: 26 % (calc) (ref 20–48)
Ferritin: 156 ng/mL (ref 38–380)
Iron: 87 ug/dL (ref 50–180)
TIBC: 330 mcg/dL (calc) (ref 250–425)

## 2021-11-17 LAB — HOMOCYSTEINE: Homocysteine: 11.4 umol/L — ABNORMAL HIGH (ref <11.4)

## 2021-11-17 LAB — ANGIOTENSIN CONVERTING ENZYME: Angiotensin-Converting Enzyme: 53 U/L (ref 9–67)

## 2021-11-17 LAB — MAGNESIUM: Magnesium: 2.1 mg/dL (ref 1.5–2.5)

## 2021-11-17 LAB — PHOSPHORUS: Phosphorus: 2.5 mg/dL (ref 2.5–4.5)

## 2021-11-17 LAB — DHEA-SULFATE: DHEA-SO4: 163 ug/dL (ref 93–415)

## 2021-11-17 LAB — VITAMIN B12: Vitamin B-12: 489 pg/mL (ref 200–1100)

## 2021-11-17 LAB — TESTOSTERONE: Testosterone: 611 ng/dL (ref 250–827)

## 2021-11-17 LAB — SEDIMENTATION RATE: Sed Rate: 6 mm/h (ref 0–15)

## 2021-11-17 LAB — INSULIN, RANDOM: Insulin: 8.1 u[IU]/mL

## 2021-12-17 ENCOUNTER — Ambulatory Visit: Payer: Self-pay

## 2022-07-03 ENCOUNTER — Other Ambulatory Visit: Payer: Self-pay | Admitting: Chiropractic Medicine

## 2022-07-03 ENCOUNTER — Ambulatory Visit: Payer: PRIVATE HEALTH INSURANCE

## 2022-07-03 DIAGNOSIS — M9909 Segmental and somatic dysfunction of abdomen and other regions: Secondary | ICD-10-CM

## 2022-07-03 DIAGNOSIS — R9389 Abnormal findings on diagnostic imaging of other specified body structures: Secondary | ICD-10-CM

## 2022-11-07 ENCOUNTER — Encounter: Payer: PRIVATE HEALTH INSURANCE | Admitting: Family Medicine
